# Patient Record
Sex: Female | Born: 1965 | Race: Black or African American | Hispanic: No | Smoking: Current every day smoker
Health system: Southern US, Community
[De-identification: ages and names within clinical notes are randomized; demographics above are authoritative.]

## PROBLEM LIST (undated history)

## (undated) DIAGNOSIS — I10 Essential (primary) hypertension: Secondary | ICD-10-CM

## (undated) DIAGNOSIS — R001 Bradycardia, unspecified: Secondary | ICD-10-CM

## (undated) HISTORY — PX: ECTOPIC PREGNANCY SURGERY: SHX613

## (undated) HISTORY — PX: SHOULDER SURGERY: SHX246

---

## 2014-02-06 ENCOUNTER — Emergency Department: Payer: Self-pay | Admitting: Emergency Medicine

## 2014-02-06 LAB — COMPREHENSIVE METABOLIC PANEL
ALBUMIN: 3.8 g/dL (ref 3.4–5.0)
ALT: 15 U/L (ref 12–78)
Alkaline Phosphatase: 61 U/L
Anion Gap: 5 — ABNORMAL LOW (ref 7–16)
BUN: 11 mg/dL (ref 7–18)
Bilirubin,Total: 0.3 mg/dL (ref 0.2–1.0)
CHLORIDE: 107 mmol/L (ref 98–107)
CO2: 27 mmol/L (ref 21–32)
Calcium, Total: 9 mg/dL (ref 8.5–10.1)
Creatinine: 1.26 mg/dL (ref 0.60–1.30)
EGFR (Non-African Amer.): 51 — ABNORMAL LOW
GFR CALC AF AMER: 59 — AB
Glucose: 107 mg/dL — ABNORMAL HIGH (ref 65–99)
OSMOLALITY: 277 (ref 275–301)
Potassium: 3.5 mmol/L (ref 3.5–5.1)
SGOT(AST): 20 U/L (ref 15–37)
Sodium: 139 mmol/L (ref 136–145)
Total Protein: 8.2 g/dL (ref 6.4–8.2)

## 2014-02-06 LAB — CBC
HCT: 34 % — ABNORMAL LOW (ref 35.0–47.0)
HGB: 11 g/dL — AB (ref 12.0–16.0)
MCH: 27.9 pg (ref 26.0–34.0)
MCHC: 32.4 g/dL (ref 32.0–36.0)
MCV: 86 fL (ref 80–100)
PLATELETS: 165 10*3/uL (ref 150–440)
RBC: 3.96 10*6/uL (ref 3.80–5.20)
RDW: 13.8 % (ref 11.5–14.5)
WBC: 14.2 10*3/uL — AB (ref 3.6–11.0)

## 2014-02-06 LAB — LIPASE, BLOOD: Lipase: 154 U/L (ref 73–393)

## 2014-04-08 ENCOUNTER — Ambulatory Visit: Payer: Self-pay | Admitting: Obstetrics and Gynecology

## 2014-07-01 ENCOUNTER — Emergency Department: Payer: Self-pay | Admitting: Emergency Medicine

## 2014-09-01 ENCOUNTER — Emergency Department (HOSPITAL_COMMUNITY): Payer: Medicaid Other

## 2014-09-01 ENCOUNTER — Encounter (HOSPITAL_COMMUNITY): Payer: Self-pay | Admitting: Emergency Medicine

## 2014-09-01 ENCOUNTER — Emergency Department (HOSPITAL_COMMUNITY)
Admission: EM | Admit: 2014-09-01 | Discharge: 2014-09-01 | Disposition: A | Discharge status: Home / Self Care | Payer: Medicaid Other | Attending: Emergency Medicine | Admitting: Emergency Medicine

## 2014-09-01 DIAGNOSIS — S0081XA Abrasion of other part of head, initial encounter: Secondary | ICD-10-CM | POA: Diagnosis not present

## 2014-09-01 DIAGNOSIS — S6992XA Unspecified injury of left wrist, hand and finger(s), initial encounter: Secondary | ICD-10-CM | POA: Diagnosis present

## 2014-09-01 DIAGNOSIS — M79645 Pain in left finger(s): Secondary | ICD-10-CM

## 2014-09-01 DIAGNOSIS — Y9301 Activity, walking, marching and hiking: Secondary | ICD-10-CM | POA: Diagnosis not present

## 2014-09-01 DIAGNOSIS — W01198A Fall on same level from slipping, tripping and stumbling with subsequent striking against other object, initial encounter: Secondary | ICD-10-CM | POA: Diagnosis not present

## 2014-09-01 DIAGNOSIS — Y9289 Other specified places as the place of occurrence of the external cause: Secondary | ICD-10-CM | POA: Insufficient documentation

## 2014-09-01 DIAGNOSIS — W19XXXA Unspecified fall, initial encounter: Secondary | ICD-10-CM

## 2014-09-01 DIAGNOSIS — Z87828 Personal history of other (healed) physical injury and trauma: Secondary | ICD-10-CM | POA: Insufficient documentation

## 2014-09-01 DIAGNOSIS — S6990XA Unspecified injury of unspecified wrist, hand and finger(s), initial encounter: Secondary | ICD-10-CM

## 2014-09-01 MED ORDER — TRAMADOL HCL 50 MG PO TABS: 50.0000 mg | ORAL_TABLET | Freq: Four times a day (QID) | ORAL | Status: DC | PRN

## 2014-09-01 MED ORDER — NAPROXEN 500 MG PO TABS: 500.0000 mg | ORAL_TABLET | Freq: Two times a day (BID) | ORAL | Status: DC

## 2014-09-01 NOTE — ED Notes (Addendum)
Pt. tripped and fell while walking this evening and injured her left thumb , presents with pain/mild swelling at left thumb and superficial abrasion at right forehead.

## 2014-09-01 NOTE — ED Notes (Signed)
Pt st's she fell earlier tonight, c/o pain in left thumb and left hand.

## 2014-09-01 NOTE — ED Provider Notes (Signed)
Medical screening examination/treatment/procedure(s) were performed by non-physician practitioner and as supervising physician I was immediately available for consultation/collaboration.   EKG Interpretation None        Terrika Zuver, MD 09/01/14 0647 

## 2014-09-01 NOTE — ED Provider Notes (Signed)
CSN: [id number]     Arrival date & time 09/01/14  0014 History   First MD Initiated Contact with Patient 09/01/14 0029     No chief complaint on file.    (Consider location/radiation/quality/duration/timing/severity/associated sxs/prior Treatment) HPI Comments: Patient presents today with a chief complaint of left thumb pain.  She reports that pain has been present since falling on an outstretched left hand just prior to arrival.  She reports that she was wearing high heels and that her heel got stuck in the ground while she was walking causing her to fall.  She reports that she did hit her forehead on the sidewalk when she fell causing an abrasion.  She denies LOC.  Denies headache, nausea, vomiting, or vision changes.  Denies neck pain.  She is currently not on any anticoagulants.  She reports that her last Tetanus was within the past year. She has not taken anything for pain prior to arrival.  Thumb pain is worse with movement.  Denies numbness or tingling.    The history is provided by the patient.    History reviewed. No pertinent past medical history. Past Surgical History  Procedure Laterality Date  . Ectopic pregnancy surgery    . Shoulder surgery     No family history on file. History  Substance Use Topics  . Smoking status: Never Smoker   . Smokeless tobacco: Not on file  . Alcohol Use: Yes   OB History   Grav Para Term Preterm Abortions TAB SAB Ect Mult Living                 Review of Systems  Eyes: Negative for pain and visual disturbance.  Gastrointestinal: Negative for nausea and vomiting.  Musculoskeletal: Positive for joint swelling. Negative for back pain and neck pain.       Left thumb pain  Skin: Positive for wound.  Neurological: Negative for numbness.      Allergies  Review of patient's allergies indicates no known allergies.  Home Medications   Prior to Admission medications   Not on File   BP 153/70  Pulse 89  Temp(Src) 98.4 F (36.9 C)  (Oral)  Resp 14  Ht 5\' 8"  (1.727 m)  Wt 200 lb (90.719 kg)  BMI 30.42 kg/m2  SpO2 98%  LMP 08/10/2014 Physical Exam  Nursing note and vitals reviewed. Constitutional: She appears well-developed and well-nourished.  HENT:  Head: Normocephalic and atraumatic.    Eyes: EOM are normal. Pupils are equal, round, and reactive to light.  Neck: Normal range of motion. Neck supple.  Cardiovascular: Normal rate, regular rhythm and normal heart sounds.   Pulses:      Radial pulses are 2+ on the right side, and 2+ on the left side.  Pulmonary/Chest: Effort normal and breath sounds normal.  Musculoskeletal:       Cervical back: She exhibits normal range of motion, no tenderness, no bony tenderness, no swelling, no edema and no deformity.       Thoracic back: She exhibits normal range of motion, no tenderness, no bony tenderness, no swelling, no edema and no deformity.       Lumbar back: She exhibits normal range of motion, no tenderness, no bony tenderness, no swelling, no edema and no deformity.  Tenderness to palpation of the MCP joint of the left thumb.  Mild swelling to the proximal thumb.  Patient with full ROM of the thumb.  Full ROM of the left wrist, elbow, and shoulder.  Full ROM  of left fingers 2-3.  No snuffbox tenderness.   Full ROM of right fingers, wrist, elbow, and shoulder.  Neurological: She is alert. She has normal strength. No cranial nerve deficit or sensory deficit. Gait normal.  Distal sensation of the left thumb intact.  Skin: Skin is warm and dry.  Good capillary refill of the left thumb  Psychiatric: She has a normal mood and affect.    ED Course  Procedures (including critical care time) Labs Review Labs Reviewed - No data to display  Imaging Review Dg Hand Complete Left  09/01/2014   CLINICAL DATA:  Fall with thumb pain at the MCP.  Initial encounter  EXAM: LEFT HAND - COMPLETE 3+ VIEW  COMPARISON:  None.  FINDINGS: There is no evidence of fracture or  dislocation. There is no evidence of arthropathy or other focal bone abnormality. Soft tissues are unremarkable.  IMPRESSION: Negative.   Electronically Signed   By: Tiburcio PeaJonathan  Watts M.D.   On: 09/01/2014 01:47     EKG Interpretation None      MDM   Final diagnoses:  Thumb pain, left   Patient presents today with a chief complaint of left thumb pain that has been present since a fall just prior to arrival.  Xray is negative.  Patient neurovascularly intact.  No wrist pain or snuffbox tenderness.  Patient given velcro thumb spica splint.  Patient also sustained an abrasion to her forehead.  Tetanus is UTD.  Abrasion cleaned in the ED.  No headache, nausea, vomiting, or vision changes.  No hematoma.  Normal neurological exam. Therefore, do not feel that any imaging of the head is indicated at this time.  Patient stable for discharge.  Return precautions given.       Santiago GladHeather Alaney Witter, PA-C 09/01/14 [phone]

## 2014-10-04 ENCOUNTER — Encounter: Payer: Self-pay | Admitting: Family Medicine

## 2014-12-13 ENCOUNTER — Emergency Department (INDEPENDENT_AMBULATORY_CARE_PROVIDER_SITE_OTHER)
Admission: EM | Admit: 2014-12-13 | Discharge: 2014-12-13 | Disposition: A | Discharge status: Nursing Home (Includes ICF) | Payer: Self-pay | Source: Home / Self Care | Attending: Emergency Medicine | Admitting: Emergency Medicine

## 2014-12-13 ENCOUNTER — Encounter (HOSPITAL_COMMUNITY): Payer: Self-pay | Admitting: Emergency Medicine

## 2014-12-13 ENCOUNTER — Emergency Department (HOSPITAL_COMMUNITY): Payer: Medicaid Other

## 2014-12-13 ENCOUNTER — Emergency Department (HOSPITAL_COMMUNITY)
Admission: EM | Admit: 2014-12-13 | Discharge: 2014-12-13 | Disposition: A | Discharge status: Home / Self Care | Payer: Medicaid Other | Attending: Emergency Medicine | Admitting: Emergency Medicine

## 2014-12-13 DIAGNOSIS — R002 Palpitations: Secondary | ICD-10-CM | POA: Insufficient documentation

## 2014-12-13 DIAGNOSIS — Z720 Tobacco use: Secondary | ICD-10-CM | POA: Insufficient documentation

## 2014-12-13 DIAGNOSIS — R079 Chest pain, unspecified: Secondary | ICD-10-CM

## 2014-12-13 DIAGNOSIS — I10 Essential (primary) hypertension: Secondary | ICD-10-CM | POA: Insufficient documentation

## 2014-12-13 DIAGNOSIS — Z791 Long term (current) use of non-steroidal anti-inflammatories (NSAID): Secondary | ICD-10-CM | POA: Insufficient documentation

## 2014-12-13 DIAGNOSIS — R001 Bradycardia, unspecified: Secondary | ICD-10-CM | POA: Insufficient documentation

## 2014-12-13 HISTORY — DX: Essential (primary) hypertension: I10

## 2014-12-13 HISTORY — DX: Bradycardia, unspecified: R00.1

## 2014-12-13 LAB — CBC WITH DIFFERENTIAL/PLATELET
Basophils Absolute: 0 10*3/uL (ref 0.0–0.1)
Basophils Relative: 0 % (ref 0–1)
Eosinophils Absolute: 0 10*3/uL (ref 0.0–0.7)
Eosinophils Relative: 1 % (ref 0–5)
HEMATOCRIT: 34.4 % — AB (ref 36.0–46.0)
Hemoglobin: 11.3 g/dL — ABNORMAL LOW (ref 12.0–15.0)
LYMPHS ABS: 3.1 10*3/uL (ref 0.7–4.0)
LYMPHS PCT: 43 % (ref 12–46)
MCH: 28.3 pg (ref 26.0–34.0)
MCHC: 32.8 g/dL (ref 30.0–36.0)
MCV: 86 fL (ref 78.0–100.0)
Monocytes Absolute: 0.5 10*3/uL (ref 0.1–1.0)
Monocytes Relative: 7 % (ref 3–12)
NEUTROS ABS: 3.6 10*3/uL (ref 1.7–7.7)
NEUTROS PCT: 49 % (ref 43–77)
PLATELETS: 169 10*3/uL (ref 150–400)
RBC: 4 MIL/uL (ref 3.87–5.11)
RDW: 14 % (ref 11.5–15.5)
WBC: 7.2 10*3/uL (ref 4.0–10.5)

## 2014-12-13 LAB — BRAIN NATRIURETIC PEPTIDE: B Natriuretic Peptide: 54.7 pg/mL (ref 0.0–100.0)

## 2014-12-13 LAB — BASIC METABOLIC PANEL
ANION GAP: 9 (ref 5–15)
BUN: 11 mg/dL (ref 6–23)
CHLORIDE: 106 mmol/L (ref 96–112)
CO2: 25 mmol/L (ref 19–32)
Calcium: 9.2 mg/dL (ref 8.4–10.5)
Creatinine, Ser: 0.92 mg/dL (ref 0.50–1.10)
GFR calc Af Amer: 85 mL/min — ABNORMAL LOW (ref >90)
GFR calc non Af Amer: 73 mL/min — ABNORMAL LOW (ref >90)
Glucose, Bld: 92 mg/dL (ref 70–99)
Potassium: 3.9 mmol/L (ref 3.5–5.1)
Sodium: 140 mmol/L (ref 135–145)

## 2014-12-13 LAB — TSH: TSH: 3.332 u[IU]/mL (ref 0.350–4.500)

## 2014-12-13 LAB — TROPONIN I

## 2014-12-13 MED ORDER — ASPIRIN 81 MG PO CHEW
CHEWABLE_TABLET | ORAL | Status: AC
  Filled 2014-12-13: qty 4

## 2014-12-13 MED ORDER — ASPIRIN 81 MG PO CHEW
324.0000 mg | CHEWABLE_TABLET | Freq: Once | ORAL | Status: AC
  Administered 2014-12-13: 324 mg via ORAL

## 2014-12-13 NOTE — ED Notes (Signed)
Pt transported to xray 

## 2014-12-13 NOTE — ED Provider Notes (Signed)
CSN: [id number]     Arrival date & time 12/13/14  1359 History   First MD Initiated Contact with Patient 12/13/14 1402     Chief Complaint  Patient presents with  . Palpitations     (Consider location/radiation/quality/duration/timing/severity/associated sxs/prior Treatment) HPI  Sandy Russo is a 49 y.o. female sent from urgent care for evaluation of bradycardia and new onset first-degree heart block. Patient reports palpitations which she describes as skipping a beat for the last week, she's had moments of chest pain onset today which lasts for seconds and are transient, there is no shortness of breath, no cough, fever, chills, history of DVT or PE, syncope, presyncopal sensation. Review of systems patient notes that her calves have hurt her for year, states that she has cramping at night. Denies any recent swelling or immobilization. Patient has past medical history significant for hypertension, self DC'd her medications but she felt that she didn't need it anymore. Patient normally follows at [facility]  Past Medical History  Diagnosis Date  . Hypertension   . Bradycardia    Past Surgical History  Procedure Laterality Date  . Ectopic pregnancy surgery    . Shoulder surgery     History reviewed. No pertinent family history. History  Substance Use Topics  . Smoking status: Current Every Day Smoker -- 0.50 packs/day  . Smokeless tobacco: Not on file  . Alcohol Use: Yes   OB History    No data available     Review of Systems  10 systems reviewed and found to be negative, except as noted in the HPI.   Allergies  Review of patient's allergies indicates no known allergies.  Home Medications   Prior to Admission medications   Medication Sig Start Date End Date Taking? Authorizing Provider  naproxen (NAPROSYN) 500 MG tablet Take 1 tablet (500 mg total) by mouth 2 (two) times daily. 09/01/14   Heather Laisure, PA-C  traMADol (ULTRAM) 50 MG tablet Take 1 tablet (50 mg  total) by mouth every 6 (six) hours as needed. 09/01/14   Heather Laisure, PA-C   BP 176/80 mmHg  Pulse 50  Temp(Src) 97.6 F (36.4 C) (Oral)  Resp 20  Ht 5\' 8"  (1.727 m)  Wt 193 lb (87.544 kg)  BMI 29.35 kg/m2  SpO2 100%  LMP 12/04/2014 Physical Exam  Constitutional: She is oriented to person, place, and time. She appears well-developed and well-nourished. No distress.  HENT:  Head: Normocephalic and atraumatic.  Mouth/Throat: Oropharynx is clear and moist.  Eyes: Conjunctivae and EOM are normal. Pupils are equal, round, and reactive to light.  Neck: Normal range of motion.  Cardiovascular: Normal rate, regular rhythm and intact distal pulses.   Pulmonary/Chest: Effort normal and breath sounds normal. No stridor. No respiratory distress. She has no wheezes. She has no rales. She exhibits no tenderness.  Abdominal: Soft. Bowel sounds are normal. She exhibits no distension and no mass. There is no tenderness. There is no rebound and no guarding.  Musculoskeletal: Normal range of motion. She exhibits no edema or tenderness.  Neurological: She is alert and oriented to person, place, and time.  Psychiatric: She has a normal mood and affect.  Nursing note and vitals reviewed.   ED Course  Procedures (including critical care time) Labs Review Labs Reviewed  CBC WITH DIFFERENTIAL/PLATELET - Abnormal; Notable for the following:    Hemoglobin 11.3 (*)    HCT 34.4 (*)    All other components within normal limits  BASIC METABOLIC PANEL - Abnormal;  Notable for the following:    GFR calc non Af Amer 73 (*)    GFR calc Af Amer 85 (*)    All other components within normal limits  TROPONIN I  BRAIN NATRIURETIC PEPTIDE  TSH    Imaging Review Dg Chest 2 View  12/13/2014   CLINICAL DATA:  Palpitations  EXAM: CHEST  2 VIEW  COMPARISON:  None.  FINDINGS: Normal heart size and mediastinal contours. No acute infiltrate or edema. No effusion or pneumothorax. No acute osseous findings.   IMPRESSION: Negative chest.   Electronically Signed   By: Marnee Spring M.D.   On: 12/13/2014 15:07     EKG Interpretation   Date/Time:  Tuesday December 13 2014 14:03:20 EST Ventricular Rate:  69 PR Interval:  182 QRS Duration: 94 QT Interval:  420 QTC Calculation: 450 R Axis:   16 Text Interpretation:  Sinus rhythm ST elev, probable normal early repol  pattern No significant change since last tracing Confirmed by YAO  MD,  DAVID ([id number]) on 12/13/2014 2:29:24 PM      MDM   Final diagnoses:  Palpitations  Bradycardia    Filed Vitals:   12/13/14 1506 12/13/14 1515 12/13/14 1530 12/13/14 1604  BP: 148/67 142/64 130/72 140/68  Pulse: 46 46 46 48  Temp:    98 F (36.7 C)  TempSrc:    Oral  Resp:    12  Height:      Weight:      SpO2: 100% 100% 100% 100%    Sandy Russo is a pleasant 49 y.o. female presenting with palpitations and fleeting chest pain.  EKG with sinus bradycardia and normal PR interval. Her EKG at urgent care showed a first-degree block. Blood work including TSH is normal, chest x-ray is negative. I will give the patient and outpatient cardiology referral. We've had an extensive discussion of return precautions.  Discussed case with attending MD who agrees with plan and stability to d/c to home.   Evaluation does not show pathology that would require ongoing emergent intervention or inpatient treatment. Pt is hemodynamically stable and mentating appropriately. Discussed findings and plan with patient/guardian, who agrees with care plan. All questions answered. Return precautions discussed and outpatient follow up given.    Wynetta Emery, PA-C 12/13/14 1658  Richardean Canal, MD 12/14/14 [phone]

## 2014-12-13 NOTE — ED Notes (Signed)
Reports chest pain and palpitations.  Noticed palpitations on Saturday.  Today at work had 2 brief episodes of chest pain: first episode of palpitation with dull pain, the second episode with sharp pain.  .  Denies nausea, no vomiting.  Relates sob with palpitations.  Reports more frequent episodes and increase in duration.  History of hypertension, but off medicine for 2 months.  Currently no pain, but feeling palpitations, when she complained of palpitations, early beat seen on screen, patient did notice this.

## 2014-12-13 NOTE — ED Notes (Signed)
Called [facility]

## 2014-12-13 NOTE — ED Notes (Signed)
Pt husband at bedside, escorted from triage.

## 2014-12-13 NOTE — Discharge Instructions (Signed)
Do not hesitate to return to the emergency room for any new, worsening or concerning symptoms. ° °Please obtain primary care using resource guide below. But the minute you were seen in the emergency room and that they will need to obtain records for further outpatient management. ° °.reso ° °Emergency Department Resource Guide °1) Find a Doctor and Pay Out of Pocket °Although you won't have to find out who is covered by your insurance plan, it is a good idea to ask around and get recommendations. You will then need to call the office and see if the doctor you have chosen will accept you as a new patient and what types of options they offer for patients who are self-pay. Some doctors offer discounts or will set up payment plans for their patients who do not have insurance, but you will need to ask so you aren't surprised when you get to your appointment. ° °2) Contact Your Local Health Department °Not all health departments have doctors that can see patients for sick visits, but many do, so it is worth a call to see if yours does. If you don't know where your local health department is, you can check in your phone book. The CDC also has a tool to help you locate your state's health department, and many state websites also have listings of all of their local health departments. ° °3) Find a Walk-in Clinic °If your illness is not likely to be very severe or complicated, you may want to try a walk in clinic. These are popping up all over the country in pharmacies, drugstores, and shopping centers. They're usually staffed by nurse practitioners or physician assistants that have been trained to treat common illnesses and complaints. They're usually fairly quick and inexpensive. However, if you have serious medical issues or chronic medical problems, these are probably not your best option. ° °No Primary Care Doctor: °- Call [facility] at  832-8000 - they can help you locate a primary care doctor that  accepts your  insurance, provides certain services, etc. °- Physician Referral Service- 1-800-533-3463 ° °Chronic Pain Problems: °Organization         Address  Phone   Notes  °[facility]  (336) 297-2271 Patients need to be referred by their primary care doctor.  ° °Medication Assistance: °Organization         Address  Phone   Notes  °[place] [facility] [address] °[place], [place] [address] (336) 641-8030 --Must be a resident of [place] °-- Must have NO insurance coverage whatsoever (no Medicaid/ Medicare, etc.) °-- The pt. MUST have a primary care doctor that directs their care regularly and follows them in the community °  °[facility]  (866) 331-1348   °[employer]  (888) 892-1162   ° °Agencies that provide inexpensive medical care: °Organization         Address  Phone   Notes  °[facility]  (336) 832-8035   °[facility]    (336) 832-7272   °[facility] [address] °[place], [place] [address] (336) 832-4777   °[facility] [address], °[place] (336) 271-4999   °[facility]    (336) 373-0678   °[facility]    (336) 272-1050   °[facility] ° [address], [place] Phone:  (336) 832-4444, Fax:  (336) 832-4440 Hours of Operation:  9 am -   6 pm, M-F.  Also accepts Medicaid/Medicare and self-pay.  °[facility] ° [address], [place] Phone: (336) 832-3150, Fax: (336) 832-3151. Hours of Operation:  8:30 am - 5:30 pm, M-F.  Also accepts Medicaid and self-pay.  °[facility] [place] [address], [place] Phone: (336) 878-6027   °[facility] [address], [place], [place] (336)723-1848, Ext. 123 Mondays & Thursdays: 7-9 AM.  First 15 patients are seen on a first come, first serve basis. °  ° °Medicaid-accepting [place] Providers: ° °Organization          Address  Phone   Notes  °[facility] [address], [place] (336) 641-2100 Also accepts self-pay patients.  °[facility] [address], [place] ° (336) 856-9996   °[facility] [address], [place] (336) 288-8857   °[facility] [address] [place] [address], [place] (336) 299-7000   °Veita Bland [address], [place]  ° (336) 373-1557 Only accepts [place] Access Medicaid patients after they have their name applied to their card.  ° °Self-Pay (no insurance) in [place]: ° °Organization         Address  Phone   Notes  °Sickle Cell Patients, [facility] [address], [place] (336) 832-1970   °[facility] [address], [place] (336) 832-4400   °[facility] ° [address], [place] (336) 992-4800   °[facility]/Dr. Osei-Bonsu ° [address] [place] [address], [place] or [address], [place] (336) 841-8500 Phone number for both [place] and [place] locations is the same.  °[facility] [address], [place] (336) 299-0000   °[facility] [address] [place] [address], [place] or [address] (336) 852-7530 °(336) 878-2260   °[facility] [address], [place] (336) 350-1642, phone; (336) 294-5005, fax Sees patients 1st and 3rd Saturday of every month.  Must not qualify for public or private insurance (i.e. Medicaid, Medicare, [place] Health Choice, Veterans' Benefits) • Household income should be no more than 200% of the poverty level •The clinic cannot treat you if you are pregnant or think you are pregnant • Sexually transmitted diseases are not treated at the clinic.  ° ° °Dental Care: °Organization         Address  Phone  Notes  °[facility] [address],   (336) 641-6152 Accepts children up to age 21 who are enrolled in Medicaid or [place] Health Choice; pregnant women with a Medicaid card; and children who have applied for Medicaid or [place] Health Choice, but were declined, whose parents can pay a reduced fee at time of service.  °[facility] [place]  [address], [place] (336) 641-7733 Accepts children up to age 21 who are enrolled in Medicaid or [place] Health Choice; pregnant women with a Medicaid card; and children who have applied for Medicaid or [place] Health Choice, but were declined, whose parents can pay a reduced fee at time of service.  °[facility] ° [address],  (336) 641-4533 Patients are seen by appointment only. Walk-ins are not accepted. [facility] will see patients 18 years of age and   older. °Monday - Tuesday (8am-5pm) °Most Wednesdays (8:30-5pm) °$30 per visit, cash only  °[facility] ° [address], [place] (336) 641-4533 Patients are seen by appointment only. Walk-ins are not accepted. [facility] will see patients 18 years of age and older. °One Wednesday Evening (Monthly: Volunteer Based).  $30 per visit, cash only  °[facility]  (919) 537-3737 for adults; Children under age 4, call Graduate Pediatric Dentistry at (919) [phone]. Children aged 4-14, please call (919) 537-3737 to request a pediatric application. ° Dental services are provided in all areas of dental care including fillings, crowns and bridges, complete and partial dentures, implants, gum treatment, root canals, and extractions. Preventive care is also provided. Treatment is provided to both adults and children. °Patients are selected via a lottery and there is often a waiting list. °  °[facility] [address], °[place] ° (336) 763-8833 [email or web address] °  °[facility] [address], [place], [place]  (336)723-1848, Ext. 123 Second and Fourth Thursday of each month, opens at 6:30 AM; Clinic ends at 9 AM.  Patients are seen on a first-come first-served basis, and a limited number are seen during each clinic.  ° °[facility] ° [address], [place], [place] (336) 723-7904   Eligibility Requirements °You must have lived in [place], [place], or [place] counties for at least the last three months. °  You cannot be eligible for state or federal sponsored healthcare insurance, including Veterans Administration, Medicaid, or Medicare. °  You generally cannot be eligible for healthcare insurance through your employer.  °  How to apply: °Eligibility screenings are held every Tuesday and Wednesday afternoon from 1:00 pm until 4:00 pm. You do not need an appointment for the interview!  °[facility] [address], [place], [place] 336-631-2330   °[facility]  336-342-8273   °[facility]  336-703-3100   °[facility]  336-570-6415   ° °Behavioral Health Resources in the Community: °Intensive Outpatient Programs °Organization         Address  Phone  Notes  °[facility] [address], [place], [place] 336-878-6098   °[facility] [address], [place], [place] 336-832-9800   °[facility] [address], [place], [place] ° 336-882-2125   °[facility] [address],  °[place], [place] 1-800-853-5163 or 336-641-4981   °Substance Abuse Resources °Organization         Address  Phone  Notes  °[facility]  336-882-2125   °[facility]  336-784-9470   °[facility]  336-285-9073   °[facility]  336-845-3988   °Residential & Outpatient Substance Abuse Program  1-800-659-3381   °Psychological Services °Organization         Address  Phone  Notes  ° Health  336- 832-9600   °[facility]  336- 378-7881    °[facility] [address], [place] 1-800-853-5163 or 336-641-4981   ° °Mobile Crisis Teams °Organization         Address  Phone  Notes  °[facility], Mobile Crisis Care Unit  1-877-626-1772   °Assertive °[facility] ° [address] [place], [place] 336-834-9664   °Sharon DeEsch [address] °[place]  336-554-5454   ° °Self-Help/Support Groups °Organization         Address    Phone             Notes  °[facility] - variety of support groups  336- 373-1402 Call for more information  °Narcotics Anonymous (NA), [facility] [address], °[place]  2 meetings at this location  ° °Residential Treatment Programs °Organization         Address  Phone  Notes  °[facility] [address],    °[place]  1-866-801-8205   °[facility] ° [address], [place], [place] 704-293-8524   °[facility] [address], [place] 336-845-3988 Admissions: 8am-3pm M-F  °[facility] [address]    °[place], [place] 336-841-1104   °[facility] [address], [place], [place] 336-379-7146   °[facility] [address]  °[place], [place] 336-285-9073   °[facility] Intensive Outpatient [address], [place], [place] 336-852-3033   °[facility] [address]  °[place], [place] 1-877-615-2722 or 336-784-9470   °[facility] [address] [place], [place] 336-227-7417 Accepts Medicaid  °[facility] [address]  °[place] 1-800-659-3381 Substance Abuse/Addiction Treatment  ° °[place] Behavioral Health Resources °Organization         Address  Phone  Notes  °[facility]  (888) 581-9988   °Julie Brannon, PhD [address] [place], [place]   (336) 349-5553 or (336) 951-0000   °[facility]   [address] °[place], [place] (336) 349-4454   °[facility] [address], [place], [place] (336) 342-8316 Insurance/Medicaid/sponsorship through [facility]  °[facility] [address]                                    [place], [place] (336) 342-8316 Therapy/tele-psych/case  °[facility] [address]  ° [place],  (336) 349-2233    °Dr. Arfeen  (336) 349-4544   °[facility] [place]  [employer] [place] [facility] 1) [address], [place] °2) [address], [place] °3)  [address], [place] (336) 349-3220 °(336) 342-7768 ° °(336) 342-8140   °[place] [facility] (336) 342-1394 or (336) 342-3537 (After Hours)    ° ° ° °

## 2014-12-13 NOTE — ED Notes (Signed)
Pt husband irritated and rude on the phone towards this RN, upset that he is not able to come back at this moment. This RN expressed via phone to the husband that a room was being cleaned for pt and that when she was moved to room this rN would come get him. Husband screaming and being verbally abusive to this RN via phone and hung up phone on this RN.    Pt went to get husband from waiting room and was informed of pt irate behavior and was told he needed to leave property to calm down. Pt informed and understood.

## 2014-12-13 NOTE — ED Notes (Signed)
Patient returned from X-ray 

## 2014-12-13 NOTE — ED Provider Notes (Addendum)
CSN: [id number]     Arrival date & time 12/13/14  1204 History   First MD Initiated Contact with Patient 12/13/14 1218     No chief complaint on file. CC: palpitations  (Consider location/radiation/quality/duration/timing/severity/associated sxs/prior Treatment) HPI  She is a 49 year old woman here for evaluation of palpitations. She states these started 3 days ago. They occur intermittently and last a few seconds. She states is just like her heart skips a beat. She didn't have any yesterday, but today when she was getting her son ready for school and at work she had several episodes that lasted a little bit longer and were associated with some left-sided chest discomfort. One episode was also associated with some shortness of breath. No associated nausea, diaphoresis, dizziness. She reports she was diagnosed with hypertension this past fall. She was placed on a medication, but then stopped taking it when it ran out because she thought she didn't need it anymore. She is a nonsmoker. Her father did have a heart attack in his late 4460s.  No past medical history on file. Past Surgical History  Procedure Laterality Date  . Ectopic pregnancy surgery    . Shoulder surgery     No family history on file. History  Substance Use Topics  . Smoking status: Never Smoker   . Smokeless tobacco: Not on file  . Alcohol Use: Yes   OB History    No data available     Review of Systems  Constitutional: Negative.   Respiratory: Positive for shortness of breath.   Cardiovascular: Positive for chest pain and palpitations. Negative for leg swelling.  Gastrointestinal: Negative for nausea.  Neurological: Negative for dizziness.    Allergies  Review of patient's allergies indicates no known allergies.  Home Medications   Prior to Admission medications   Medication Sig Start Date End Date Taking? Authorizing Provider  naproxen (NAPROSYN) 500 MG tablet Take 1 tablet (500 mg total) by mouth 2 (two) times  daily. 09/01/14   Heather Laisure, PA-C  traMADol (ULTRAM) 50 MG tablet Take 1 tablet (50 mg total) by mouth every 6 (six) hours as needed. 09/01/14   Heather Laisure, PA-C   BP 165/90 mmHg  Pulse 90  Temp(Src) 98.2 F (36.8 C) (Oral)  Resp 16  SpO2 98% Physical Exam  Constitutional: She is oriented to person, place, and time. She appears well-developed and well-nourished. No distress.  Became a little tearful when discussing transfer to ER.  Neck: Neck supple. No JVD present.  Cardiovascular: Regular rhythm, S1 normal and S2 normal.  Bradycardia present.  Exam reveals no gallop.   No murmur heard. Pulses:      Radial pulses are 2+ on the right side, and 2+ on the left side.  No carotid bruit  Pulmonary/Chest: Effort normal and breath sounds normal. No respiratory distress. She has no wheezes. She has no rales.  Musculoskeletal: She exhibits no edema or tenderness.  Neurological: She is alert and oriented to person, place, and time.    ED Course  ED EKG  Date/Time: 12/13/2014 12:52 PM Performed by: Charm RingsHONIG, Aubreanna Percle J Authorized by: Charm RingsHONIG, Celena Lanius J Interpreted by ED physician Previous ECG: no previous ECG available Rhythm: sinus bradycardia Ectopy comments: Rare PAC on rhythm strip Rate: bradycardic BPM: 49 QRS axis: normal Conduction: 1st degree ST Segments: ST segments normal T Waves: T waves normal Clinical impression: abnormal ECG Comments: Sinus bradycardia with first-degree AV block. Occasional PACs seen on rhythm strip.   (including critical care time) Labs Review  Labs Reviewed - No data to display  Imaging Review No results found.   MDM   1. Palpitations   2. Chest pain, unspecified chest pain type    Will transfer to [facility] ER for additional evaluation of palpations with associated chest pain. Cardiac monitor placed. Aspirin 324 mg PO given. She also has first-degree AV block. I have no prior EKG to compare this to. Her pulse is in the high 40s, my only  comparison is an ER visit from October when her pulse rate was in the 80s.    Charm Rings, MD 12/13/14 1255  Charm Rings, MD 12/13/14 1255

## 2014-12-13 NOTE — ED Notes (Signed)
carelink not available

## 2014-12-13 NOTE — ED Notes (Signed)
[facility] this [facility], pt states 2/10 this morning. [facility], denies any nitro taken. Pt denies any pain at this time. Pt does have hx of bradycardia and also hx of new onset htn, states is not taking medications due to side effects. NAD noted at this time.

## 2014-12-16 ENCOUNTER — Encounter (HOSPITAL_COMMUNITY): Payer: Self-pay | Admitting: Emergency Medicine

## 2014-12-16 ENCOUNTER — Emergency Department (HOSPITAL_COMMUNITY)
Admission: EM | Admit: 2014-12-16 | Discharge: 2014-12-16 | Disposition: A | Discharge status: Home / Self Care | Payer: Medicaid Other | Attending: Emergency Medicine | Admitting: Emergency Medicine

## 2014-12-16 ENCOUNTER — Emergency Department (HOSPITAL_COMMUNITY): Payer: Medicaid Other

## 2014-12-16 DIAGNOSIS — R42 Dizziness and giddiness: Secondary | ICD-10-CM | POA: Insufficient documentation

## 2014-12-16 DIAGNOSIS — I951 Orthostatic hypotension: Secondary | ICD-10-CM

## 2014-12-16 DIAGNOSIS — Z720 Tobacco use: Secondary | ICD-10-CM | POA: Insufficient documentation

## 2014-12-16 DIAGNOSIS — R110 Nausea: Secondary | ICD-10-CM | POA: Insufficient documentation

## 2014-12-16 DIAGNOSIS — I10 Essential (primary) hypertension: Secondary | ICD-10-CM | POA: Insufficient documentation

## 2014-12-16 DIAGNOSIS — Z791 Long term (current) use of non-steroidal anti-inflammatories (NSAID): Secondary | ICD-10-CM | POA: Insufficient documentation

## 2014-12-16 DIAGNOSIS — R55 Syncope and collapse: Secondary | ICD-10-CM | POA: Insufficient documentation

## 2014-12-16 LAB — URINALYSIS, ROUTINE W REFLEX MICROSCOPIC
Bilirubin Urine: NEGATIVE
GLUCOSE, UA: NEGATIVE mg/dL
Hgb urine dipstick: NEGATIVE
Ketones, ur: NEGATIVE mg/dL
LEUKOCYTES UA: NEGATIVE
Nitrite: NEGATIVE
Protein, ur: NEGATIVE mg/dL
Specific Gravity, Urine: 1.01 (ref 1.005–1.030)
Urobilinogen, UA: 0.2 mg/dL (ref 0.0–1.0)
pH: 6 (ref 5.0–8.0)

## 2014-12-16 LAB — CBC WITH DIFFERENTIAL/PLATELET
BASOS PCT: 0 % (ref 0–1)
Basophils Absolute: 0 10*3/uL (ref 0.0–0.1)
EOS ABS: 0.1 10*3/uL (ref 0.0–0.7)
Eosinophils Relative: 1 % (ref 0–5)
HCT: 33.1 % — ABNORMAL LOW (ref 36.0–46.0)
HEMOGLOBIN: 11 g/dL — AB (ref 12.0–15.0)
Lymphocytes Relative: 18 % (ref 12–46)
Lymphs Abs: 1.6 10*3/uL (ref 0.7–4.0)
MCH: 28.1 pg (ref 26.0–34.0)
MCHC: 33.2 g/dL (ref 30.0–36.0)
MCV: 84.7 fL (ref 78.0–100.0)
MONOS PCT: 8 % (ref 3–12)
Monocytes Absolute: 0.7 10*3/uL (ref 0.1–1.0)
Neutro Abs: 6.7 10*3/uL (ref 1.7–7.7)
Neutrophils Relative %: 73 % (ref 43–77)
Platelets: 177 10*3/uL (ref 150–400)
RBC: 3.91 MIL/uL (ref 3.87–5.11)
RDW: 13.8 % (ref 11.5–15.5)
WBC: 9 10*3/uL (ref 4.0–10.5)

## 2014-12-16 LAB — BASIC METABOLIC PANEL
Anion gap: 8 (ref 5–15)
BUN: 11 mg/dL (ref 6–23)
CALCIUM: 9.2 mg/dL (ref 8.4–10.5)
CO2: 28 mmol/L (ref 19–32)
Chloride: 99 mmol/L (ref 96–112)
Creatinine, Ser: 0.93 mg/dL (ref 0.50–1.10)
GFR calc Af Amer: 84 mL/min — ABNORMAL LOW (ref >90)
GFR, EST NON AFRICAN AMERICAN: 72 mL/min — AB (ref >90)
GLUCOSE: 163 mg/dL — AB (ref 70–99)
POTASSIUM: 3.5 mmol/L (ref 3.5–5.1)
Sodium: 135 mmol/L (ref 135–145)

## 2014-12-16 LAB — ETHANOL

## 2014-12-16 LAB — POC URINE PREG, ED: Preg Test, Ur: NEGATIVE

## 2014-12-16 MED ORDER — SODIUM CHLORIDE 0.9 % IV BOLUS (SEPSIS): 1000.0000 mL | Freq: Once | INTRAVENOUS | Status: DC

## 2014-12-16 NOTE — ED Notes (Signed)
Pt arrives via EMS from home with c/o syncopal episode, states she was getting up to go to bed, became dizzy and began to stagger with ambulation. Husband caught her when she lost consciousness. Emesis x1, 4MG  zofran given PTA. 20G R forearm. Seen in facility on Tuesday for palpitations and bradycardia.

## 2014-12-16 NOTE — Discharge Instructions (Signed)
Syncope Sandy Russo, you or passing out because her blood pressure medication is too strong. Do not take this medication anymore and follow-up with your cardiologist during her next appointment. Be sure to stay well hydrated and drink plenty of fluids every day. If your symptoms Worsen come back to emergency department immediately. Thank you. Syncope means a person passes out (faints). The person usually wakes up in less than 5 minutes. It is important to seek medical care for syncope. HOME CARE  Have someone stay with you until you feel normal.  Do not drive, use machines, or play sports until your doctor says it is okay.  Keep all doctor visits as told.  Lie down when you feel like you might pass out. Take deep breaths. Wait until you feel normal before standing up.  Drink enough fluids to keep your pee (urine) clear or pale yellow.  If you take blood pressure or heart medicine, get up slowly. Take several minutes to sit and then stand. GET HELP RIGHT AWAY IF:   You have a severe headache.  You have pain in the chest, belly (abdomen), or back.  You are bleeding from the mouth or butt (rectum).  You have black or tarry poop (stool).  You have an irregular or very fast heartbeat.  You have pain with breathing.  You keep passing out, or you have shaking (seizures) when you pass out.  You pass out when sitting or lying down.  You feel confused.  You have trouble walking.  You have severe weakness.  You have vision problems. If you fainted, call your local emergency services (911 in [place] Do not drive yourself to the hospital. MAKE SURE YOU:   Understand these instructions.  Will watch your condition.  Will get help right away if you are not doing well or get worse. Document Released: 04/08/2008 Document Revised: 04/21/2012 Document Reviewed: 12/20/2011 [facility] Patient Information 2015 [employer] [place]. This information is not intended to replace advice given to you  by your health care provider. Make sure you discuss any questions you have with your health care provider.

## 2014-12-16 NOTE — ED Provider Notes (Signed)
CSN: [id number]     Arrival date & time 12/16/14  0106 History  This chart was scribed for Sandy Crumble, MD by Abel Presto, ED Scribe. This patient was seen in room A11C/A11C and the patient's care was started at 1:12 AM.    No chief complaint on file.    Patient is a 49 y.o. female presenting with syncope. The history is provided by the patient. No language interpreter was used.  Loss of Consciousness Episode history:  Single Most recent episode:  Today Duration:  20 seconds Chronicity:  New Context: standing up   Witnessed: yes   Associated symptoms: diaphoresis, dizziness, malaise/fatigue, nausea and weakness     HPI Comments: Sandy Russo is a 49 y.o. female brought in by ambulance, with PMHx of HTN and bradycardia who presents to the Emergency Department complaining of LOC PTA. Pt states she stood up from seated position and was walking to bedroom when she began feel weak, dizzy, and off balance. She states her "hearing was muffled". Pt notes she called for her husband and remembers waking up on the floor. Husband notes she was out for approximately 20 seconds. Pt notes EtOH use of 1 wine cooler this evening. Pt notes associated dizziness, nausea, diaphoresis, and  fatigue. Pt notes this has never happened before. Pt has been taking HCTZ since October 2015.  Pt last took a dose this morning. Pt was seen in ED on 12/13/14 for bradycardia and palpitations. Pt denies any pain, numbness or tingling, fever, rhinorrhea, and diarrhea. Pt has an appointment scheduled with a cardiologist in March.   Past Medical History  Diagnosis Date  . Hypertension   . Bradycardia    Past Surgical History  Procedure Laterality Date  . Ectopic pregnancy surgery    . Shoulder surgery     No family history on file. History  Substance Use Topics  . Smoking status: Current Every Day Smoker -- 0.50 packs/day  . Smokeless tobacco: Not on file  . Alcohol Use: Yes   OB History    No data available      Review of Systems  Constitutional: Positive for malaise/fatigue and diaphoresis.  Cardiovascular: Positive for syncope.  Gastrointestinal: Positive for nausea.  Neurological: Positive for dizziness and weakness.   A complete 10 system review of systems was obtained and all systems are negative except as noted in the HPI and PMH.     Allergies  Review of patient's allergies indicates no known allergies.  Home Medications   Prior to Admission medications   Medication Sig Start Date End Date Taking? Authorizing Provider  naproxen (NAPROSYN) 500 MG tablet Take 1 tablet (500 mg total) by mouth 2 (two) times daily. 09/01/14   Heather Laisure, PA-C  traMADol (ULTRAM) 50 MG tablet Take 1 tablet (50 mg total) by mouth every 6 (six) hours as needed. 09/01/14   Santiago Glad, PA-C   LMP 12/04/2014 Physical Exam  Constitutional: She is oriented to person, place, and time. She appears well-developed and well-nourished. No distress.  HENT:  Head: Normocephalic and atraumatic.  Nose: Nose normal.  Mouth/Throat: Oropharynx is clear and moist. No oropharyngeal exudate.  Eyes: Conjunctivae and EOM are normal. Pupils are equal, round, and reactive to light. No scleral icterus.  Neck: Normal range of motion. Neck supple. No JVD present. No tracheal deviation present. No thyromegaly present.  Cardiovascular: Normal rate, regular rhythm and normal heart sounds.  Exam reveals no gallop and no friction rub.   No murmur heard. Pulmonary/Chest: Effort  normal and breath sounds normal. No respiratory distress. She has no wheezes. She exhibits no tenderness.  Abdominal: Soft. Bowel sounds are normal. She exhibits no distension and no mass. There is no tenderness. There is no rebound and no guarding.  Musculoskeletal: Normal range of motion. She exhibits no edema or tenderness.  Lymphadenopathy:    She has no cervical adenopathy.  Neurological: She is alert and oriented to person, place, and time. No  cranial nerve deficit. She exhibits normal muscle tone.  Skin: Skin is warm and dry. No rash noted. No erythema. No pallor.  Nursing note and vitals reviewed.   ED Course  Procedures (including critical care time) DIAGNOSTIC STUDIES: Oxygen Saturation is 100% on  room air normal by my interpretation.    COORDINATION OF CARE: 1:20 AM Discussed treatment plan with patient at beside, the patient agrees with the plan and has no further questions at this time.   Labs Review Labs Reviewed  CBC WITH DIFFERENTIAL/PLATELET - Abnormal; Notable for the following:    Hemoglobin 11.0 (*)    HCT 33.1 (*)    All other components within normal limits  BASIC METABOLIC PANEL - Abnormal; Notable for the following:    Glucose, Bld 163 (*)    GFR calc non Af Amer 72 (*)    GFR calc Af Amer 84 (*)    All other components within normal limits  ETHANOL  URINALYSIS, ROUTINE W REFLEX MICROSCOPIC  POC URINE PREG, ED    Imaging Review Dg Chest 2 View  12/16/2014   CLINICAL DATA:  Syncope, hypertension and bradycardia.  EXAM: CHEST  2 VIEW  COMPARISON:  Chest radiograph December 13, 2014  FINDINGS: Go the cardiac silhouette is mildly enlarged, mediastinal silhouette is unremarkable. The pleural effusions or focal consolidations. No pneumothorax. Soft tissue planes and included osseous structures are nonsuspicious. Mild broad dextroscoliosis of the thoracolumbar junction could be positional.  IMPRESSION: Mild cardiomegaly, no acute pulmonary process.   Electronically Signed   By: Awilda Metroourtnay  Bloomer   On: 12/16/2014 01:55     EKG Interpretation   Date/Time:  Friday December 16 2014 01:27:14 EST Ventricular Rate:  48 PR Interval:  207 QRS Duration: 91 QT Interval:  481 QTC Calculation: 430 R Axis:   19 Text Interpretation:  Sinus bradycardia First degree A-V block Confirmed  by Erroll Lunani, Mika Anastasi Ayokunle [phone]([id number]) on 12/16/2014 1:35:39 AM      MDM   Final diagnoses:  None   Patient presents to the  emergency department for syncope. Patient has bradycardia as well, this makes it symptomatic. She was recently seen for the same thing, since this is the second occurrence I will consult cardiology for possible admission.Dr. Benay PillowSze evaluated the patient and believes her syncope is due to her chlorthalidone medication. Patient is orthostatic in the emergency department, she was given 1 L of IV fluids. She is able to fully function and do work during the day and thus it is likely not due to her bradycardia. Patient will retain her cardiology appointment, I will DC chlorthalidone medication for now. Her vital signs currently remain within her normal limits and she is safe for discharge.    I personally performed the services described in this documentation, which was scribed in my presence. The recorded information has been reviewed and is accurate.    Sandy CrumbleAdeleke Arrietty Dercole, MD 12/16/14 [phone]

## 2014-12-16 NOTE — Consult Note (Addendum)
Referring Physician:  ER Primary Physician: Primary Cardiologist: Reason for Consultation: Syncope   HPI: 48yoF with hx of HTN and 1st degree AV block who presents to the ER following a syncopal event in setting of recently restarting her chlorthalidone.  Ms. Sandy Russo was recently seen in 2/09 after experiencing palpitations and an ECG showing 1st degree AVB and sinus bradycardia.  She was evaluated in the [facility] ER and discharged home with follow up with a cardiologist.  Since that time she has resumed taking chlorthalidone.  She has experienced leg cramps with the chlorthalidone and has noted being light headed with standing.     At baseline she is able to walk up and down stairs without difficulty and does not feel limited in her activity in her work at the [employer].  Review of Systems:     Cardiac Review of Systems: {Y] = yes  = no  Chest Pain [    ]  Resting SOB [   ] Exertional SOB  [  ]  Orthopnea [  ]   Pedal Edema [   ]    Palpitations [ x ] Syncope  [ x ]   Presyncope [   ]  General Review of Systems: [Y] = yes [  ]=no Constitional: recent weight change [  ]; anorexia [  ]; fatigue [  ]; nausea [  ]; night sweats [  ]; fever [  ]; or chills [  ];                                                                     Eyes : blurred vision [  ]; diplopia [   ]; vision changes [  ];  Amaurosis fugax[  ]; Resp: cough [  ];  wheezing[  ];  hemoptysis[  ];  PND [  ];  GI:  gallstones[  ], vomiting[  ];  dysphagia[  ]; melena[  ];  hematochezia [  ]; heartburn[  ];   GU: kidney stones [  ]; hematuria[  ];   dysuria [  ];  nocturia[  ]; incontinence [  ];             Skin: rash, swelling[  ];, hair loss[  ];  peripheral edema[  ];  or itching[  ]; Musculosketetal: myalgias[ x ];  joint swelling[  ];  joint erythema[  ];  joint pain[  ];  back pain[  ];  Heme/Lymph: bruising[  ];  bleeding[  ];  anemia[  ];  Neuro: TIA[  ];  headaches[  ];  stroke[  ];  vertigo[  ];  seizures[   ];   paresthesias[  ];  difficulty walking[  ];  Psych:depression[  ]; anxiety[  ];  Endocrine: diabetes[  ];  thyroid dysfunction[  ];  Other:  Past Medical History  Diagnosis Date  . Hypertension   . Bradycardia      (Not in a hospital admission)      Infusions: . sodium chloride      No Known Allergies  History   Social History  . Marital Status: Unknown    Spouse Name: N/A  . Number of Children: N/A  . Years of Education:  N/A   Occupational History  . Not on file.   Social History Main Topics  . Smoking status: Current Every Day Smoker -- 0.50 packs/day  . Smokeless tobacco: Not on file  . Alcohol Use: Yes  . Drug Use: No  . Sexual Activity: Not on file   Other Topics Concern  . Not on file   Social History Narrative    No family history on file.  PHYSICAL EXAM: Filed Vitals:   12/16/14 0128  BP: 115/57  Pulse: 49  Temp: 97.8 F (36.6 C)  Resp: 16    No intake or output data in the 24 hours ending 12/16/14 0342  General:  Well appearing. No respiratory difficulty HEENT: normal Neck: supple. no JVD. Carotids 2+ bilat; no bruits. No lymphadenopathy or thryomegaly appreciated. Cor: PMI nondisplaced. Regular rate & rhythm. No rubs, gallops or murmurs. Lungs: clear Abdomen: soft, nontender, nondistended. No hepatosplenomegaly. No bruits or masses. Good bowel sounds. Extremities: no cyanosis, clubbing, rash, edema Neuro: alert & oriented x 3, cranial nerves grossly intact. moves all 4 extremities w/o difficulty. Affect pleasant.  ECG: sinus bradycardia with 1st degree AVB  Results for orders placed or performed during the hospital encounter of 12/16/14 (from the past 24 hour(s))  CBC with Differential/Platelet     Status: Abnormal   Collection Time: 12/16/14  2:15 AM  Result Value Ref Range   WBC 9.0 4.0 - 10.5 K/uL   RBC 3.91 3.87 - 5.11 MIL/uL   Hemoglobin 11.0 (L) 12.0 - 15.0 g/dL   HCT 60.433.1 (L) 54.036.0 - 98.146.0 %   MCV 84.7 78.0 - 100.0 fL     MCH 28.1 26.0 - 34.0 pg   MCHC 33.2 30.0 - 36.0 g/dL   RDW 19.113.8 47.811.5 - 29.515.5 %   Platelets 177 150 - 400 K/uL   Neutrophils Relative % 73 43 - 77 %   Neutro Abs 6.7 1.7 - 7.7 K/uL   Lymphocytes Relative 18 12 - 46 %   Lymphs Abs 1.6 0.7 - 4.0 K/uL   Monocytes Relative 8 3 - 12 %   Monocytes Absolute 0.7 0.1 - 1.0 K/uL   Eosinophils Relative 1 0 - 5 %   Eosinophils Absolute 0.1 0.0 - 0.7 K/uL   Basophils Relative 0 0 - 1 %   Basophils Absolute 0.0 0.0 - 0.1 K/uL  Basic metabolic panel     Status: Abnormal   Collection Time: 12/16/14  2:15 AM  Result Value Ref Range   Sodium 135 135 - 145 mmol/L   Potassium 3.5 3.5 - 5.1 mmol/L   Chloride 99 96 - 112 mmol/L   CO2 28 19 - 32 mmol/L   Glucose, Bld 163 (H) 70 - 99 mg/dL   BUN 11 6 - 23 mg/dL   Creatinine, Ser 6.210.93 0.50 - 1.10 mg/dL   Calcium 9.2 8.4 - 30.810.5 mg/dL   GFR calc non Af Amer 72 (L) >90 mL/min   GFR calc Af Amer 84 (L) >90 mL/min   Anion gap 8 5 - 15  Ethanol     Status: None   Collection Time: 12/16/14  2:15 AM  Result Value Ref Range   Alcohol, Ethyl (B) <5 0 - 9 mg/dL   Dg Chest 2 View  6/57/84692/10/2015   CLINICAL DATA:  Syncope, hypertension and bradycardia.  EXAM: CHEST  2 VIEW  COMPARISON:  Chest radiograph December 13, 2014  FINDINGS: Go the cardiac silhouette is mildly enlarged, mediastinal silhouette is unremarkable. The pleural  effusions or focal consolidations. No pneumothorax. Soft tissue planes and included osseous structures are nonsuspicious. Mild broad dextroscoliosis of the thoracolumbar junction could be positional.  IMPRESSION: Mild cardiomegaly, no acute pulmonary process.   Electronically Signed   By: Awilda Metro   On: 12/16/2014 01:55     ASSESSMENT: 48yoF with likely orthostatic hypotension secondary to her diuretic.  Does not appear to have chronotropic incompetence on exam (HR 67 while speaking with her).  1.  D/c chlorthalidone, follow up with PMD for alternative anti-HTN 2.  Check orthostatics   3.  Received 1L IVF already, if still orthostatic would give additional liter 4.  F/u with cardiology as outpatient for bradycardia 5.  F/u with PMD regarding work up and treatment of iron deficiency anemia  Dossie Arbour, MD Cardiology Fellow   PLAN/DISCUSSION:

## 2015-01-04 ENCOUNTER — Encounter: Payer: Self-pay | Admitting: Cardiovascular Disease

## 2015-01-04 ENCOUNTER — Ambulatory Visit (INDEPENDENT_AMBULATORY_CARE_PROVIDER_SITE_OTHER): Payer: Self-pay | Admitting: Cardiovascular Disease

## 2015-01-04 VITALS — BP 114/70 | HR 70 | Ht 68.0 in | Wt 190.0 lb

## 2015-01-04 DIAGNOSIS — R001 Bradycardia, unspecified: Secondary | ICD-10-CM

## 2015-01-04 DIAGNOSIS — R55 Syncope and collapse: Secondary | ICD-10-CM

## 2015-01-04 NOTE — Progress Notes (Signed)
Patient ID: Sandy Russo, female   DOB: July 20, 1966, 49 y.o.   MRN: [id number]     Cardiology Office Note   Date:  01/04/2015   ID:  Sandy Russo, DOB 1966/04/23, MRN [id number]  PCP:  Pcp Not In System  Cardiologist:   Charlton Haws, MD   No chief complaint on file.     History of Present Illness: Sandy Russo is a 49 y.o. female who presents for evaluation of bradycardia and "syncope"  Hx of HTN and 1st degree AV block who presents to the ER following a syncopal event in setting of recently restarting her chlorthalidone.  She noted  palpitations and an ECG in ER showing 1st degree AVB and sinus bradycardia. She was evaluated in the [facility] ER and discharged home with follow up with a cardiologist.  Since that time she has resumed taking chlorthalidone. She has experienced leg cramps with the chlorthalidone and has noted being light headed with standing.   At baseline she is able to walk up and down stairs without difficulty and does not feel limited in her activity in her work at the [employer].  She had no issues with leg pain or lighheadedness before diuretic  Moved from [place] a few months ago  She thinks stress of this increased her BP and her primary started her on diuretic a few months ago.    No palpitations, chest pain or dyspnea      Past Medical History  Diagnosis Date  . Hypertension   . Bradycardia     Past Surgical History  Procedure Laterality Date  . Ectopic pregnancy surgery    . Shoulder surgery       No current outpatient prescriptions on file.   No current facility-administered medications for this visit.    Allergies:   Review of patient's allergies indicates no known allergies.    Social History:  The patient  reports that she has been smoking.  She does not have any smokeless tobacco history on file. She reports that she drinks alcohol. She reports that she does not use illicit drugs.   Family History:  The patient's family history  is not on file.    ROS:  Please see the history of present illness.   Otherwise, review of systems are positive for none.   All other systems are reviewed and negative.    PHYSICAL EXAM: VS:  LMP 12/04/2014 , BMI There is no weight on file to calculate BMI. GEN: Well nourished, well developed, in no acute distress HEENT: normal Neck: no JVD, carotid bruits, or masses Cardiac:  RRR; no murmurs, rubs, or gallops,no edema  Respiratory:  clear to auscultation bilaterally, normal work of breathing GI: soft, nontender, nondistended, + BS MS: no deformity or atrophy Skin: warm and dry, no rash Neuro:  Strength and sensation are intact Psych: euthymic mood, full affect   EKG:   SR rate 48 PR 207  Normal QRS and ST segments 12/13/14    Recent Labs: 12/13/2014: B Natriuretic Peptide 54.7; TSH 3.332 12/16/2014: BUN 11; Creatinine 0.93; Hemoglobin 11.0*; Platelets 177; Potassium 3.5; Sodium 135    Lipid Panel No results found for: CHOL, TRIG, HDL, CHOLHDL, VLDL, LDLCALC, LDLDIRECT    Wt Readings from Last 3 Encounters:  12/13/14 87.544 kg (193 lb)  09/01/14 90.719 kg (200 lb)      Other studies Reviewed: Additional studies/ records that were reviewed today include: [facility] records.    ASSESSMENT AND PLAN:  1.  Bradycardia.  Don't think this is significant.  F/U ETT to r/o chronotropic incompetence and make sure no higher gradde HB other than borderline first degree 2. HTN  BP fairly low now  She clearly does not like diuretic with dizziness and calf cramps  Stop it  Will see what BP is at time of ETT and response to exercise Would consider amlodipine if she needs to be Rx   3. Presyncope  Likely related to diuretic  Doubt brady arrhythmia  Echo to r/o structural heart disease    Current medicines are reviewed at length with the patient today.  The patient does not have concerns regarding medicines.  The following changes have been made:  DC Chlorthalidone  Labs/ tests ordered  today include: ETT, Echo  No orders of the defined types were placed in this encounter.     Disposition:   FU with me after tests       Signed, Charlton HawsPeter Aracelia Brinson, MD  01/04/2015 8:08 AM    [facility] [address], [place], [place]  [address] Phone: [phone]; Fax: [phone]

## 2015-01-04 NOTE — Patient Instructions (Signed)
Your physician has recommended you make the following change in your medication: stop taking Chlorthalidone   Your physician has requested that you have an echocardiogram. Echocardiography is a painless test that uses sound waves to create images of your heart. It provides your doctor with information about the size and shape of your heart and how well your heart's chambers and valves are working. This procedure takes approximately one hour. There are no restrictions for this procedure.   Your physician has requested that you have an exercise tolerance test. For further information please visit [email or web address]. Please also follow instruction sheet, as given.  Your physician recommends that you schedule a follow-up appointment in: after tests.

## 2015-01-12 ENCOUNTER — Other Ambulatory Visit (HOSPITAL_COMMUNITY): Payer: Medicaid Other

## 2015-01-12 ENCOUNTER — Telehealth: Payer: Self-pay | Admitting: Cardiovascular Disease

## 2015-01-12 NOTE — Telephone Encounter (Signed)
Pt came in for her Echo, but found out that her Medicaid Family plan is not accepted at this office.  She would be responsible for the bill.  She stated she would like to contact her social worker to see if she could switch plans before coming in for the test.  She has an ETT and f/u visit in April which she has kept for now.  Please advise her if she needs to have the Echo before the ETT in case her insurance does not go thru before April vists

## 2015-01-13 NOTE — Telephone Encounter (Signed)
LM TO CALL BACK ./CY 

## 2015-01-16 NOTE — Telephone Encounter (Signed)
LEFT  VOICE  MAIL  THAT  ORDER  OF  HAVING TESTS DOES NOT  MATTER  .Zack Seal/CY

## 2015-02-15 ENCOUNTER — Encounter: Payer: Medicaid Other | Admitting: Nurse Practitioner

## 2015-02-22 ENCOUNTER — Ambulatory Visit: Payer: Medicaid Other | Admitting: Cardiovascular Disease

## 2016-09-23 IMAGING — CR DG HAND COMPLETE 3+V*L*
3 series · 3 of 3 positions shown · non-contrast
Comparison: None.

CLINICAL DATA: Fall with thumb pain at the MCP.  Initial encounter

EXAM:
LEFT HAND - COMPLETE 3+ VIEW

[x hand pa left]
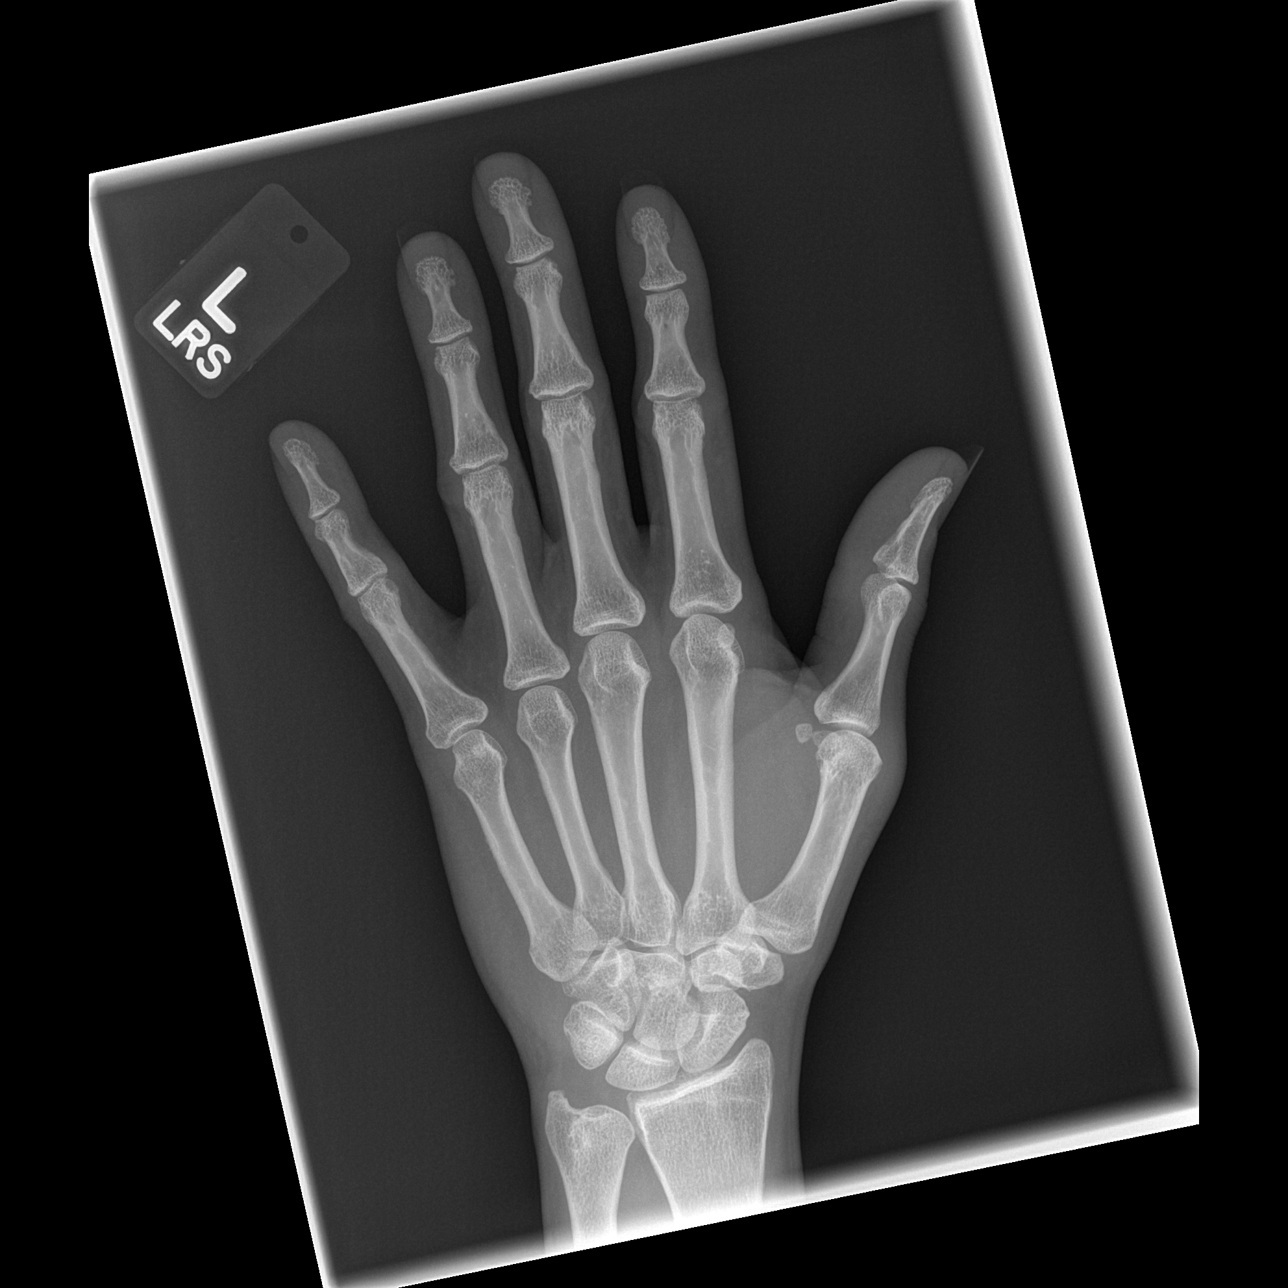

[x hand oblique left]
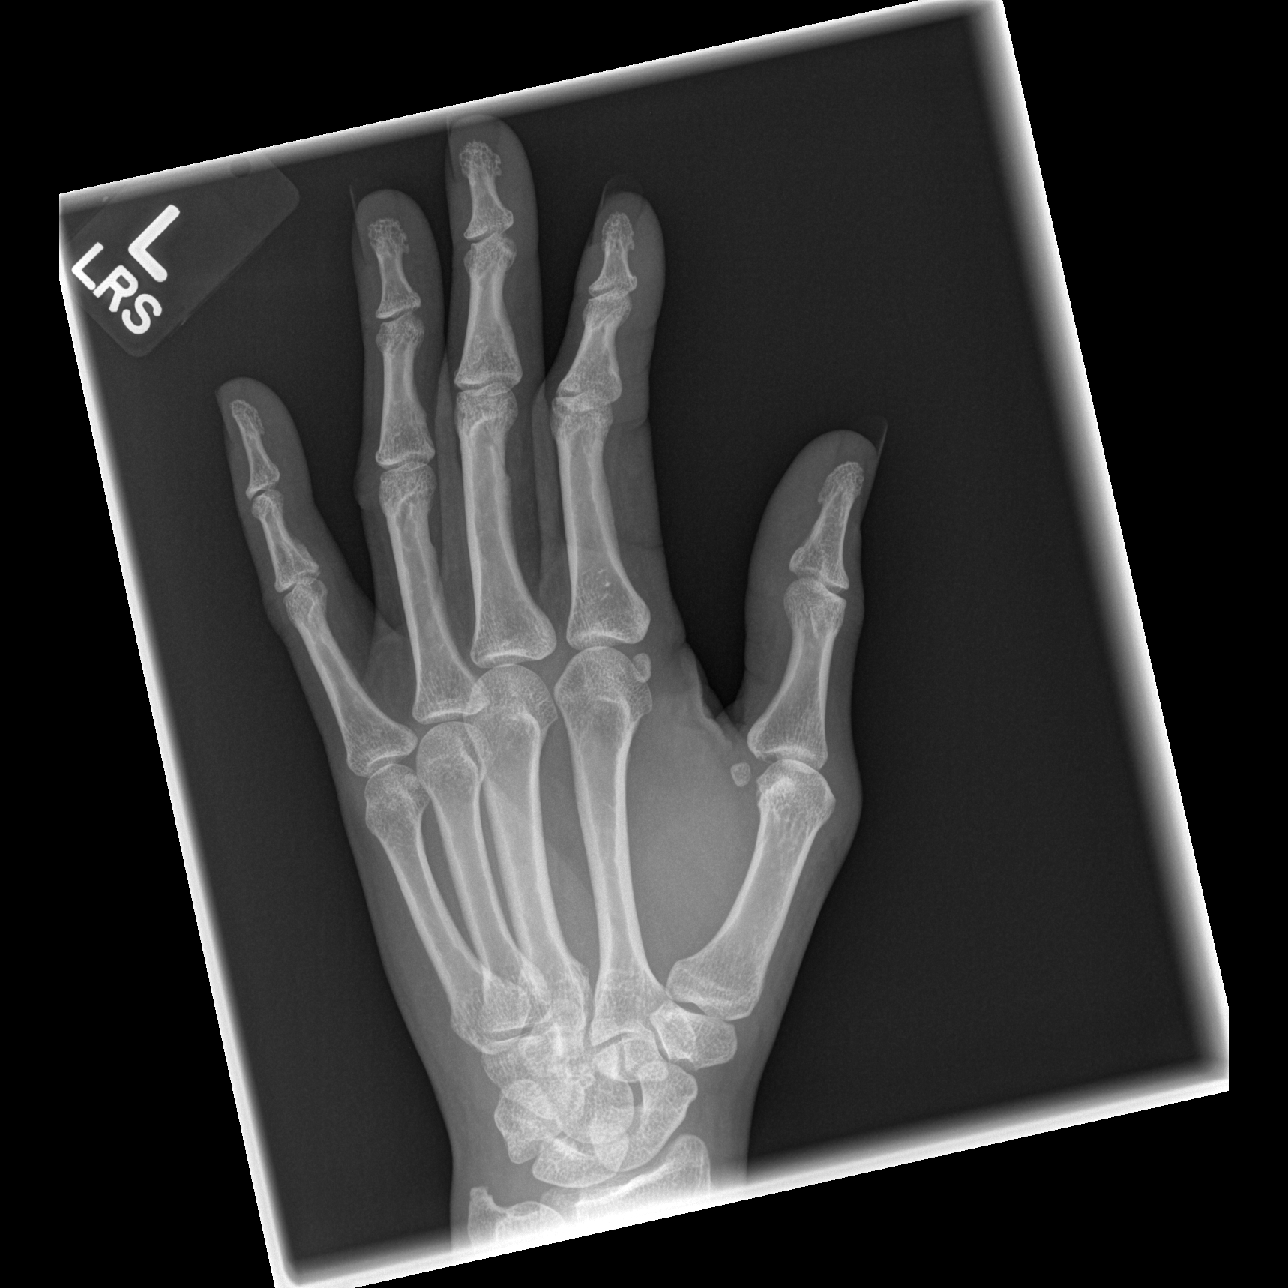

[x hand lat left]
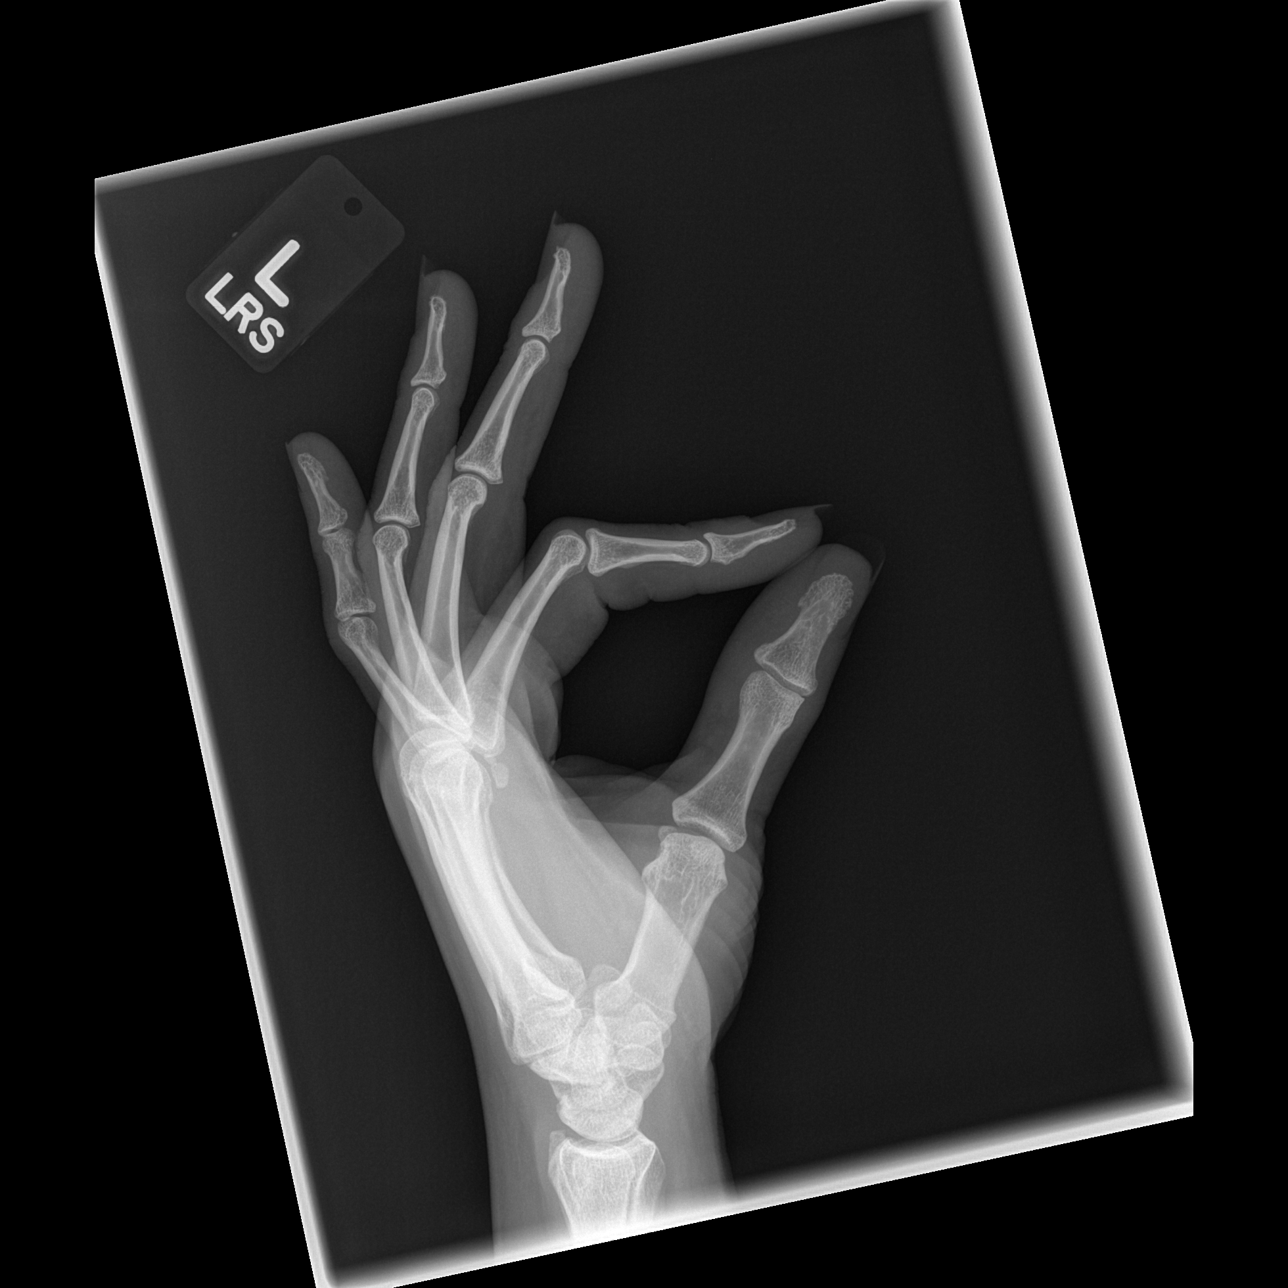

[3 of 3 positions shown; findings below may reference images not displayed]

FINDINGS: There is no evidence of fracture or dislocation. There is no
evidence of arthropathy or other focal bone abnormality. Soft
tissues are unremarkable.
IMPRESSION: Negative.
